# Patient Record
Sex: Female | Born: 2000 | Race: White | Marital: Single
Health system: Northeastern US, Academic
[De-identification: ages and names within clinical notes are randomized; demographics above are authoritative.]

## PROBLEM LIST (undated history)

## (undated) DIAGNOSIS — E049 Nontoxic goiter, unspecified: Secondary | ICD-10-CM

## (undated) DIAGNOSIS — E063 Autoimmune thyroiditis: Secondary | ICD-10-CM

## (undated) DIAGNOSIS — N62 Hypertrophy of breast: Secondary | ICD-10-CM

## (undated) HISTORY — PX: TONSILLECTOMY: SUR1361

---
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
## 2019-07-02 ENCOUNTER — Ambulatory Visit: Payer: Self-pay | Admitting: Osteopathic Medicine

## 2019-11-30 ENCOUNTER — Ambulatory Visit: Payer: Self-pay | Attending: Internal Medicine

## 2019-11-30 DIAGNOSIS — Z23 Encounter for immunization: Secondary | ICD-10-CM | POA: Insufficient documentation

## 2019-11-30 NOTE — Progress Notes (Signed)
   Covid-19 Vaccination Clinic  Name:  Lashara Urey    MRN: [id number] DOB: November 07, 2000  11/30/2019  Ms. Deihl was observed post Covid-19 immunization for 15 minutes without incident. She was provided with Vaccine Information Sheet and instruction to access the V-Safe system.   Ms. Quinlivan was instructed to call 911 with any severe reactions post vaccine: Marland Kitchen Difficulty breathing  . Swelling of face and throat  . A fast heartbeat  . A bad rash all over body  . Dizziness and weakness   Immunizations Administered    Name Date Dose VIS Date Route   Pfizer COVID-19 Vaccine 11/30/2019  5:44 PM 0.3 mL 09/04/2019 Intramuscular   Manufacturer: [employer], [employer]   Lot: [id number]   NDC: [address]-9

## 2019-12-21 ENCOUNTER — Ambulatory Visit: Payer: Self-pay | Attending: Internal Medicine

## 2019-12-21 DIAGNOSIS — Z23 Encounter for immunization: Secondary | ICD-10-CM

## 2019-12-21 NOTE — Progress Notes (Signed)
   Covid-19 Vaccination Clinic  Name:  Paula Franco    MRN: [id number] DOB: April 29, 2001  12/21/2019  Paula Franco was observed post Covid-19 immunization for 15 minutes without incident. She was provided with Vaccine Information Sheet and instruction to access the V-Safe system.   Paula Franco was instructed to call 911 with any severe reactions post vaccine: Marland Kitchen Difficulty breathing  . Swelling of face and throat  . A fast heartbeat  . A bad rash all over body  . Dizziness and weakness   Immunizations Administered    Name Date Dose VIS Date Route   Pfizer COVID-19 Vaccine 12/21/2019 11:14 AM 0.3 mL 09/04/2019 Intramuscular   Manufacturer: [employer], [employer]   Lot: [id number]   NDC: [address]-9

## 2021-05-19 ENCOUNTER — Emergency Department (HOSPITAL_COMMUNITY)
Admission: EM | Admit: 2021-05-19 | Discharge: 2021-05-20 | Disposition: A | Discharge status: Home / Self Care | Payer: BC Managed Care – PPO | Attending: Emergency Medicine | Admitting: Emergency Medicine

## 2021-05-19 ENCOUNTER — Emergency Department (HOSPITAL_COMMUNITY): Payer: BC Managed Care – PPO

## 2021-05-19 ENCOUNTER — Other Ambulatory Visit: Payer: Self-pay

## 2021-05-19 ENCOUNTER — Encounter (HOSPITAL_COMMUNITY): Payer: Self-pay

## 2021-05-19 DIAGNOSIS — T18198A Other foreign object in esophagus causing other injury, initial encounter: Secondary | ICD-10-CM | POA: Insufficient documentation

## 2021-05-19 DIAGNOSIS — K222 Esophageal obstruction: Secondary | ICD-10-CM | POA: Diagnosis not present

## 2021-05-19 DIAGNOSIS — T189XXA Foreign body of alimentary tract, part unspecified, initial encounter: Secondary | ICD-10-CM | POA: Diagnosis present

## 2021-05-19 DIAGNOSIS — K2210 Ulcer of esophagus without bleeding: Secondary | ICD-10-CM | POA: Insufficient documentation

## 2021-05-19 DIAGNOSIS — K2289 Other specified disease of esophagus: Secondary | ICD-10-CM | POA: Insufficient documentation

## 2021-05-19 DIAGNOSIS — Z20822 Contact with and (suspected) exposure to covid-19: Secondary | ICD-10-CM | POA: Diagnosis not present

## 2021-05-19 DIAGNOSIS — X58XXXA Exposure to other specified factors, initial encounter: Secondary | ICD-10-CM | POA: Diagnosis not present

## 2021-05-19 DIAGNOSIS — R1310 Dysphagia, unspecified: Secondary | ICD-10-CM

## 2021-05-19 LAB — POC URINE PREG, ED: Preg Test, Ur: NEGATIVE

## 2021-05-19 MED ORDER — LORAZEPAM 2 MG/ML IJ SOLN
1.0000 mg | Freq: Once | INTRAMUSCULAR | Status: AC
  Administered 2021-05-19: 1 mg via INTRAMUSCULAR
  Filled 2021-05-19: qty 1

## 2021-05-19 MED ORDER — GLUCAGON HCL RDNA (DIAGNOSTIC) 1 MG IJ SOLR
1.0000 mg | Freq: Once | INTRAMUSCULAR | Status: AC
  Administered 2021-05-19: 1 mg via INTRAMUSCULAR
  Filled 2021-05-19: qty 1

## 2021-05-19 MED ORDER — STERILE WATER FOR INJECTION IJ SOLN
INTRAMUSCULAR | Status: AC
  Administered 2021-05-19: 1 mL via INTRAMUSCULAR
  Filled 2021-05-19: qty 10

## 2021-05-19 MED ORDER — LIDOCAINE VISCOUS HCL 2 % MT SOLN
15.0000 mL | Freq: Once | OROMUCOSAL | Status: AC
  Administered 2021-05-19: 15 mL via ORAL
  Filled 2021-05-19: qty 15

## 2021-05-19 MED ORDER — ALUM & MAG HYDROXIDE-SIMETH 200-200-20 MG/5ML PO SUSP
30.0000 mL | Freq: Once | ORAL | Status: AC
  Administered 2021-05-19: 30 mL via ORAL
  Filled 2021-05-19: qty 30

## 2021-05-19 NOTE — ED Provider Notes (Signed)
Emergency Medicine Provider Triage Evaluation Note  Paula Franco , a 20 y.o. female  was evaluated in triage.  Pt complains of feeling like a pill stuck in her throat.  Patient reports that she tried to take a tablet of ibuprofen around 430 and felt like it got stuck, now she feels like she cannot swallow her saliva and is spitting this into a bag.  Patient is very anxious and tearful and states that she does not usually take pills she usually takes liquids that she is always have trouble swallowing these.  She has never been told that she has an esophageal stricture or problems with her esophagus.  She reports she usually does not have issues when eating or drinking.  Review of Systems  Positive: Difficulty swallowing Negative: Abdominal pain, fever, shortness of breath  Physical Exam  BP (!) 146/91 (BP Location: Left Arm)   Pulse (!) 106   Temp 98.6 F (37 C) (Oral)   Resp 18   SpO2 99%  Gen:   Awake, patient is tearful and extremely anxious Resp:  Normal effort, no stridor MSK:   Moves extremities without difficulty  Other:  Spitting saliva into a bag  Medical Decision Making  Medically screening exam initiated at 6:25 PM.  Appropriate orders placed.  Nigel Wessman was informed that the remainder of the evaluation will be completed by another provider, this initial triage assessment does not replace that evaluation, and the importance of remaining in the ED until their evaluation is complete.  Ordered GI cocktail to help with irritation from likely pill esophagitis   Dartha Lodge, PA-C 05/19/21 1840    Rozelle Logan, DO 05/19/21 2310

## 2021-05-19 NOTE — ED Provider Notes (Addendum)
[facility]-EMERGENCY DEPT Provider Note   CSN: [id number] Arrival date & time: 05/19/21  1731     History Chief Complaint  Patient presents with   Swallowed Foreign Body    Paula Franco is a 20 y.o. female who presents to the ED today with complaint of FB sensation in throat. Pt reportedly swallowed a OTC tablet of Ibuprofen around 4:30 PM today and felt like it got stuck. She states that she does have a fear of pills and feels like she may have gotten anxious prior to taking the pill. Since that time she has had trouble swallowing her saliva as well as any liquids. She has not tried solids. She does admit she does not typically swallow pills of any kind. She denies shortness of breath. No other complaints at this time.   The history is provided by the patient and medical records.      History reviewed. No pertinent past medical history.  There are no problems to display for this patient.   History reviewed. No pertinent surgical history.   OB History   No obstetric history on file.     History reviewed. No pertinent family history.     Home Medications Prior to Admission medications   Medication Sig Start Date End Date Taking? Authorizing Provider  ALTAVERA 0.15-30 MG-MCG tablet Take 1 tablet by mouth daily. 05/10/21  Yes [provider]  ibuprofen (ADVIL) 200 MG tablet Take 200 mg by mouth every 6 (six) hours as needed.   Yes [provider]    Allergies    Patient has no known allergies.  Review of Systems   Review of Systems  Constitutional:  Negative for fever.  HENT:  Positive for drooling and trouble swallowing.   Respiratory:  Negative for shortness of breath.   All other systems reviewed and are negative.  Physical Exam Updated Vital Signs BP 121/76   Pulse 85   Temp 98.6 F (37 C) (Oral)   Resp 18   SpO2 98%   Physical Exam Vitals and nursing note reviewed.  Constitutional:      Appearance: She is not  ill-appearing or diaphoretic.     Comments: Speaking in full sentences without stridor however continually spitting her saliva into an emesis bag at bedside  HENT:     Head: Normocephalic and atraumatic.     Mouth/Throat:     Comments: Posterior oropharynx clear. Uvula midline.  Eyes:     Conjunctiva/sclera: Conjunctivae normal.  Cardiovascular:     Rate and Rhythm: Normal rate and regular rhythm.  Pulmonary:     Effort: Pulmonary effort is normal.     Breath sounds: Normal breath sounds. No wheezing or rhonchi.  Abdominal:     Palpations: Abdomen is soft.     Tenderness: There is no abdominal tenderness. There is no guarding or rebound.  Musculoskeletal:     Cervical back: Neck supple.  Skin:    General: Skin is warm and dry.  Neurological:     Mental Status: She is alert.    ED Results / Procedures / Treatments   Labs (all labs ordered are listed, but only abnormal results are displayed) Labs Reviewed  POC URINE PREG, ED    EKG None  Radiology CT Soft Tissue Neck Wo Contrast  Result Date: 05/19/2021 CLINICAL DATA:  Initial evaluation for possible foreign body, swallowed pill, currently still stuck in throat. EXAM: CT NECK WITHOUT CONTRAST TECHNIQUE: Multidetector CT imaging of the neck was performed  following the standard protocol without intravenous contrast. COMPARISON:  None available. FINDINGS: Pharynx and larynx: Oral cavity within normal limits. No acute abnormality seen about the dentition. Oropharynx and nasopharynx within normal limits. No retropharyngeal collection or swelling. Epiglottis normal. Vallecula clear. Remainder of the hypopharynx and supraglottic larynx within normal limits. Piriform sinuses clear. Glottis within normal limits. Subglottic airway widely patent clear. Intraluminal fluid density seen within a short-segment of the upper esophageal lumen, measuring approximately 3.4 cm in length (series 6, image 45). Visualized upper esophagus otherwise  unremarkable. No visible radiopaque foreign body. Salivary glands: Salivary glands including the parotid and submandibular glands are within normal limits. Thyroid: Thyroid not well seen on this noncontrast examination. No obvious abnormality. Lymph nodes: No enlarged or pathologic adenopathy within the neck. Vascular: Evaluation of the vascular structures limited given lack of IV contrast. Limited intracranial: Unremarkable. Visualized orbits: Unremarkable. Mastoids and visualized paranasal sinuses: Scattered mucosal thickening noted about the ethmoidal air cells and maxillary sinuses bilaterally. Paranasal sinuses are otherwise clear. Mastoid air cells and middle ear cavities are well pneumatized and free of fluid. Skeleton: Visualized osseous structures within normal limits. No discrete or worrisome osseous lesions. Upper chest: Visualized upper chest demonstrates no other acute finding. Partially visualized lungs are clear. Other: None. IMPRESSION: 1. Intraluminal fluid density within a short-segment of the upper esophageal lumen, measuring approximately 3.4 cm in length. No other visible radiopaque foreign body. 2. Otherwise negative CT of the neck. No other acute abnormality identified. Electronically Signed   By: Rise Mu M.D.   On: 05/19/2021 22:37    Procedures Procedures   Medications Ordered in ED Medications  alum & mag hydroxide-simeth (MAALOX/MYLANTA) 200-200-20 MG/5ML suspension 30 mL (30 mLs Oral Given 05/19/21 1836)    And  lidocaine (XYLOCAINE) 2 % viscous mouth solution 15 mL (15 mLs Oral Given 05/19/21 1835)  LORazepam (ATIVAN) injection 1 mg (1 mg Intramuscular Given 05/19/21 1938)  glucagon (human recombinant) (GLUCAGEN) injection 1 mg (1 mg Intramuscular Given 05/19/21 2328)  sterile water (preservative free) injection (1 mL Intramuscular Given 05/19/21 2329)    ED Course  I have reviewed the triage vital signs and the nursing notes.  Pertinent labs & imaging results  that were available during my care of the patient were reviewed by me and considered in my medical decision making (see chart for details).    MDM Rules/Calculators/A&P                           20 year old female who presents to the ED today with complaint of foreign body sensation in throat after swallowing 1 over-the-counter tablet of ibuprofen around 4:30 PM today.  Has not been tolerating her own secretions since that time.  On arrival to the ED patient was afebrile, mildly tachycardic at 106, nontachypneic, satting 99% on room air.  Was initially provided with GI cocktail in triage without relief.  Continues to not be able to drink liquids of any kind.  She was brought back to her room and provided with 1 dose of IM Ativan for relaxation as patient does have history of GAD and seems to be very anxious at this time.  I had attempted to have her drink water afterwards however she spit it right back up.  Continues to spit into an emesis bag throughout examination however speaking in full sentences.  I had attempted to lightly massage her throat while she swallowed without success as well.  Has no  stridor on exam.  She denies any pain.  Her lungs are clear to auscultation bilaterally.  We will plan for CT soft tissue neck at this time for further evaluation of any blockage however patient denies eating or drinking anything else besides the 1 tablet of ibuprofen.   CT: IMPRESSION:  1. Intraluminal fluid density within a short-segment of the upper  esophageal lumen, measuring approximately 3.4 cm in length. No other  visible radiopaque foreign body.  2. Otherwise negative CT of the neck. No other acute abnormality  identified.   Discussed case with gastroenterologist Dr. Loreta Ave who recommends glucagon and otherwise trying a small amount of food to see if pt can essentially push the fluid down as this may cause her relief. Does not think she requires EGD.   IM glucagon provided without desired  effect. Pt attempted to drink water however regurgitated it back up immediately.   At shift change case signed out to Sharilyn Sites, PA-C, who will continue monitoring patient and attempt to have her drink. May need to re-consult GI in the AM if pt still unable to tolerate secretions.  This note was prepared using Dragon voice recognition software and may include unintentional dictation errors due to the inherent limitations of voice recognition software.   Final Clinical Impression(s) / ED Diagnoses Final diagnoses:  None    Rx / DC Orders ED Discharge Orders     None           Tanda Rockers, PA-C 05/19/21 2350    Koleen Distance, MD 05/20/21 2328

## 2021-05-19 NOTE — ED Triage Notes (Signed)
Pt reports choking on a pill around 430p that is still currently still stuck in her throat. Pt is unable to swallow saliva at this time.

## 2021-05-20 ENCOUNTER — Emergency Department (HOSPITAL_COMMUNITY): Payer: BC Managed Care – PPO | Admitting: Anesthesiology

## 2021-05-20 ENCOUNTER — Encounter (HOSPITAL_COMMUNITY): Payer: Self-pay | Admitting: *Deleted

## 2021-05-20 ENCOUNTER — Encounter (HOSPITAL_COMMUNITY)
Admission: EM | Disposition: A | Discharge status: Home / Self Care | Payer: Self-pay | Source: Home / Self Care | Attending: Emergency Medicine

## 2021-05-20 HISTORY — PX: ESOPHAGOGASTRODUODENOSCOPY: SHX5428

## 2021-05-20 HISTORY — PX: FOREIGN BODY REMOVAL: SHX962

## 2021-05-20 HISTORY — PX: BIOPSY: SHX5522

## 2021-05-20 LAB — RESP PANEL BY RT-PCR (FLU A&B, COVID) ARPGX2
Influenza A by PCR: NEGATIVE
Influenza B by PCR: NEGATIVE
SARS Coronavirus 2 by RT PCR: NEGATIVE

## 2021-05-20 SURGERY — EGD (ESOPHAGOGASTRODUODENOSCOPY)
Anesthesia: Monitor Anesthesia Care

## 2021-05-20 MED ORDER — PROPOFOL 500 MG/50ML IV EMUL
INTRAVENOUS | Status: DC | PRN
  Administered 2021-05-20: 120 ug/kg/min via INTRAVENOUS

## 2021-05-20 MED ORDER — SODIUM CHLORIDE 0.9 % IV SOLN: INTRAVENOUS | Status: DC

## 2021-05-20 MED ORDER — LIDOCAINE 2% (20 MG/ML) 5 ML SYRINGE
INTRAMUSCULAR | Status: DC | PRN
  Administered 2021-05-20: 60 mg via INTRAVENOUS

## 2021-05-20 MED ORDER — PROPOFOL 10 MG/ML IV BOLUS
INTRAVENOUS | Status: AC
  Filled 2021-05-20: qty 20

## 2021-05-20 MED ORDER — SODIUM CHLORIDE 0.9 % IV BOLUS
1000.0000 mL | Freq: Once | INTRAVENOUS | Status: AC
  Administered 2021-05-20: 1000 mL via INTRAVENOUS

## 2021-05-20 MED ORDER — LACTATED RINGERS IV SOLN: INTRAVENOUS | Status: DC | PRN

## 2021-05-20 MED ORDER — PROPOFOL 10 MG/ML IV BOLUS
INTRAVENOUS | Status: DC | PRN
  Administered 2021-05-20: 30 mg via INTRAVENOUS
  Administered 2021-05-20: 40 mg via INTRAVENOUS
  Administered 2021-05-20: 10 mg via INTRAVENOUS
  Administered 2021-05-20 (×2): 20 mg via INTRAVENOUS

## 2021-05-20 NOTE — Anesthesia Postprocedure Evaluation (Signed)
Anesthesia Post Note  Patient: Paula Franco  Procedure(s) Performed: ESOPHAGOGASTRODUODENOSCOPY (EGD) FOREIGN BODY REMOVAL     Patient location during evaluation: PACU Anesthesia Type: MAC Level of consciousness: awake and alert Pain management: pain level controlled Vital Signs Assessment: post-procedure vital signs reviewed and stable Respiratory status: spontaneous breathing and respiratory function stable Cardiovascular status: stable Postop Assessment: no apparent nausea or vomiting Anesthetic complications: no   No notable events documented.  Last Vitals:  Vitals:   05/20/21 0854 05/20/21 0859  BP: 106/69 (!) 112/59  Pulse: 81 (!) 55  Resp: 16 14  Temp:    SpO2: 99% 96%    Last Pain:  Vitals:   05/20/21 0859  TempSrc:   PainSc: 0-No pain                 Griselda Tosh DANIEL

## 2021-05-20 NOTE — ED Provider Notes (Signed)
Assumed care at shift change.  See prior notes for full H&P.  Briefly, 20 y.o. F here with trouble swallowing after taking ibuprofen tablet.  Here in the ED, has been unable to handle her secretions and is continuously spitting into emesis bag.  CT soft tissue of neck obtained with intraluminal fluid density along upper esophageal lumen without other visible foreign body.  Given ativan, tried massage of the neck, without relief.  GI was consulted and recommended trial of glucagon.    Plan:  given glucagon, will observe.  No change with glucagon.  Attempted to try to relax patient further.  Still no change.  Will observe here a bit to see if any improvement.  5:28 AM Attempted to have patient drink water once again-- she clearly swallows the water and within 5 seconds she immediately vomits it back up.  She did this twice in my presence with same result.  Still feels like something is stuck in her throat.  Will discuss with GI again, likely will need EGD.  Discussed with Dr. Loreta Ave-- will perform EGD this morning, keep NPO.  Covid screen sent.   Garlon Hatchet, PA-C 05/20/21 1607    Gilda Crease, MD 05/20/21 [phone]

## 2021-05-20 NOTE — ED Provider Notes (Signed)
Patient is status post EGD with Dr. Loreta Ave.  I have spoken with Dr. Loreta Ave who is giving the patient outpatient medications including Carafate and a PPI along with specific outpatient follow-up and directions.  This is all discussed with the patient and her husband.  She has appears otherwise appropriate for discharge with strict return precautions.   Arthor Captain, PA-C 05/20/21 1001    Virgina Norfolk, DO 05/20/21 1216

## 2021-05-20 NOTE — Anesthesia Preprocedure Evaluation (Addendum)
Anesthesia Evaluation  Patient identified by MRN, date of birth, ID band Patient awake    Reviewed: Allergy & Precautions, NPO status , Patient's Chart, lab work & pertinent test results  History of Anesthesia Complications Negative for: history of anesthetic complications  Airway Mallampati: II   Neck ROM: Full    Dental no notable dental hx. (+) Dental Advisory Given   Pulmonary neg pulmonary ROS,    Pulmonary exam normal        Cardiovascular negative cardio ROS Normal cardiovascular exam     Neuro/Psych negative neurological ROS     GI/Hepatic Neg liver ROS,   Endo/Other  negative endocrine ROS  Renal/GU negative Renal ROS     Musculoskeletal negative musculoskeletal ROS (+)   Abdominal   Peds  Hematology negative hematology ROS (+)   Anesthesia Other Findings   Reproductive/Obstetrics                            Anesthesia Physical Anesthesia Plan  ASA: 2 and emergent  Anesthesia Plan: General   Post-op Pain Management:    Induction:   PONV Risk Score and Plan: 3 and Ondansetron and Propofol infusion  Airway Management Planned: Natural Airway  Additional Equipment:   Intra-op Plan:   Post-operative Plan:   Informed Consent: I have reviewed the patients History and Physical, chart, labs and discussed the procedure including the risks, benefits and alternatives for the proposed anesthesia with the patient or authorized representative who has indicated his/her understanding and acceptance.     Dental advisory given  Plan Discussed with: Anesthesiologist and CRNA  Anesthesia Plan Comments:        Anesthesia Quick Evaluation

## 2021-05-20 NOTE — ED Notes (Signed)
Pt transported to endo.  

## 2021-05-20 NOTE — Discharge Instructions (Addendum)
Please follow-up as directed by Dr. Loreta Ave and take the medication she has prescribed.  Return if you are unable to tolerate swallowing fluids.

## 2021-05-20 NOTE — Consult Note (Signed)
UNASSIGNED PATIENT Reason for Consult: Difficulty swallowing. Referring Physician: ER MD  Paula Franco is an 20 y.o. female.  HPI: Paula Franco is a 20 year old white female who was in her usual state of health till about 4:30 PM yesterday when she took of ibuprofen and was not able to swallow it down.  Since then she has had difficulty managing her secretions and has been in the emergency room all through the night.  A CT of the neck revealed intraluminal density in the upper esophagus.  She claims she ate a bagel at about 11 AM yesterday but had no problems swallowing the bagel.  She had some problems swallowing pills up in the past but denies any dysphagia for solids or liquids. She denies recent use of antibiotics like doxycycline and has never had a food impaction before.  She denies a previous history of acid reflux or indigestion.  History reviewed.  Hypothyroidism, anxiety disorder.  History reviewed.  Tonsillectomy 2013, Breast reduction 2018.  History reviewed. No pertinent family history.  Social History:  has no history on file for tobacco use, alcohol use, and drug use.  Allergies: No Known Allergies  Medications: I have reviewed the patient's current medications.  Results for orders placed or performed during the hospital encounter of 05/19/21 (from the past 48 hour(s))  POC urine preg, ED     Status: None   Collection Time: 05/19/21  9:39 PM  Result Value Ref Range   Preg Test, Ur NEGATIVE NEGATIVE    Comment:        THE SENSITIVITY OF THIS METHODOLOGY IS >24 mIU/mL   Resp Panel by RT-PCR (Flu A&B, Covid) Nasopharyngeal Swab     Status: None   Collection Time: 05/20/21  5:36 AM   Specimen: Nasopharyngeal Swab; Nasopharyngeal(NP) swabs in vial transport medium  Result Value Ref Range   SARS Coronavirus 2 by RT PCR NEGATIVE NEGATIVE    Comment: (NOTE) SARS-CoV-2 target nucleic acids are NOT DETECTED.  The SARS-CoV-2 RNA is generally detectable in upper  respiratory specimens during the acute phase of infection. The lowest concentration of SARS-CoV-2 viral copies this assay can detect is 138 copies/mL. A negative result does not preclude SARS-Cov-2 infection and should not be used as the sole basis for treatment or other patient management decisions. A negative result may occur with  improper specimen collection/handling, submission of specimen other than nasopharyngeal swab, presence of viral mutation(s) within the areas targeted by this assay, and inadequate number of viral copies(<138 copies/mL). A negative result must be combined with clinical observations, patient history, and epidemiological information. The expected result is Negative.  Fact Sheet for Patients:  BloggerCourse.com  Fact Sheet for Healthcare Providers:  SeriousBroker.it  This test is no t yet approved or cleared by the [place] FDA and  has been authorized for detection and/or diagnosis of SARS-CoV-2 by FDA under an Emergency Use Authorization (EUA). This EUA will remain  in effect (meaning this test can be used) for the duration of the COVID-19 declaration under Section 564(b)(1) of the Act, 21 U.S.C.section 360bbb-3(b)(1), unless the authorization is terminated  or revoked sooner.       Influenza A by PCR NEGATIVE NEGATIVE   Influenza B by PCR NEGATIVE NEGATIVE    Comment: (NOTE) The Xpert Xpress SARS-CoV-2/FLU/RSV plus assay is intended as an aid in the diagnosis of influenza from Nasopharyngeal swab specimens and should not be used as a sole basis for treatment. Nasal washings and aspirates are unacceptable for Xpert Xpress  SARS-CoV-2/FLU/RSV testing.  Fact Sheet for Patients: BloggerCourse.com  Fact Sheet for Healthcare Providers: SeriousBroker.it  This test is not yet approved or cleared by the [place] FDA and has been authorized for  detection and/or diagnosis of SARS-CoV-2 by FDA under an Emergency Use Authorization (EUA). This EUA will remain in effect (meaning this test can be used) for the duration of the COVID-19 declaration under Section 564(b)(1) of the Act, 21 U.S.C. section 360bbb-3(b)(1), unless the authorization is terminated or revoked.  Performed at [facility], [address] [place], [place] [address]     CT Soft Tissue Neck Wo Contrast  Result Date: 05/19/2021 CLINICAL DATA:  Initial evaluation for possible foreign body, swallowed pill, currently still stuck in throat. EXAM: CT NECK WITHOUT CONTRAST TECHNIQUE: Multidetector CT imaging of the neck was performed following the standard protocol without intravenous contrast. COMPARISON:  None available. FINDINGS: Pharynx and larynx: Oral cavity within normal limits. No acute abnormality seen about the dentition. Oropharynx and nasopharynx within normal limits. No retropharyngeal collection or swelling. Epiglottis normal. Vallecula clear. Remainder of the hypopharynx and supraglottic larynx within normal limits. Piriform sinuses clear. Glottis within normal limits. Subglottic airway widely patent clear. Intraluminal fluid density seen within a short-segment of the upper esophageal lumen, measuring approximately 3.4 cm in length (series 6, image 45). Visualized upper esophagus otherwise unremarkable. No visible radiopaque foreign body. Salivary glands: Salivary glands including the parotid and submandibular glands are within normal limits. Thyroid: Thyroid not well seen on this noncontrast examination. No obvious abnormality. Lymph nodes: No enlarged or pathologic adenopathy within the neck. Vascular: Evaluation of the vascular structures limited given lack of IV contrast. Limited intracranial: Unremarkable. Visualized orbits: Unremarkable. Mastoids and visualized paranasal sinuses: Scattered mucosal thickening noted about the ethmoidal air cells and  maxillary sinuses bilaterally. Paranasal sinuses are otherwise clear. Mastoid air cells and middle ear cavities are well pneumatized and free of fluid. Skeleton: Visualized osseous structures within normal limits. No discrete or worrisome osseous lesions. Upper chest: Visualized upper chest demonstrates no other acute finding. Partially visualized lungs are clear. Other: None. IMPRESSION: 1. Intraluminal fluid density within a short-segment of the upper esophageal lumen, measuring approximately 3.4 cm in length. No other visible radiopaque foreign body. 2. Otherwise negative CT of the neck. No other acute abnormality identified. Electronically Signed   By: Rise Mu M.D.   On: 05/19/2021 22:37    Review of Systems Blood pressure 131/89, pulse 86, temperature 98.6 F (37 C), temperature source Oral, resp. rate 16, SpO2 97 %. Physical Exam Constitutional:      General: She is in acute distress.  HENT:     Head: Atraumatic.     Mouth/Throat:     Mouth: Mucous membranes are dry.  Eyes:     Extraocular Movements: Extraocular movements intact.     Pupils: Pupils are equal, round, and reactive to light.  Cardiovascular:     Rate and Rhythm: Normal rate and regular rhythm.  Abdominal:     General: Bowel sounds are normal.  Musculoskeletal:     Cervical back: Normal range of motion and neck supple.  Skin:    General: Skin is warm and dry.  Neurological:     General: No focal deficit present.     Mental Status: She is alert and oriented to person, place, and time.  Psychiatric:        Mood and Affect: Mood normal.        Behavior: Behavior normal.  Thought Content: Thought content normal.        Judgment: Judgment normal.   Assessment/Plan: Acute dysphagia with an abnormal CT of the neck-we will proceed with an EGD at this time as the patient is unable to swallow her secretions.  Charna Elizabeth 05/20/2021, 7:10 AM

## 2021-05-20 NOTE — Transfer of Care (Signed)
Immediate Anesthesia Transfer of Care Note  Patient: Paula Franco  Procedure(s) Performed: ESOPHAGOGASTRODUODENOSCOPY (EGD) FOREIGN BODY REMOVAL  Patient Location: Endoscopy Unit  Anesthesia Type:MAC  Level of Consciousness: drowsy  Airway & Oxygen Therapy: Patient Spontanous Breathing and Patient connected to face mask oxygen  Post-op Assessment: Report given to RN and Post -op Vital signs reviewed and stable  Post vital signs: Reviewed and stable  Last Vitals:  Vitals Value Taken Time  BP 108/61   Temp    Pulse 70   Resp 18   SpO2 98     Last Pain:  Vitals:   05/20/21 0747  TempSrc: Oral  PainSc: 0-No pain         Complications: No notable events documented.

## 2021-05-20 NOTE — Op Note (Signed)
[facility] Patient Name: Paula Franco Procedure Date: 05/20/2021 MRN: [id number] Attending MD: Juanita Craver , MD Date of Birth: 02/06/2001 CSN: [id number] Age: 20 Admit Type: Outpatient Procedure:                EGD with biospies and removal of pill lodged in                            esophagus. Indications:              Dysphagia, Abnormal CT of esophagus. Providers:                Juanita Craver, MD, Particia Nearing, RN, Elspeth Cho                            Tech., Technician, Lerry Paterson, CRNA Referring MD:             ER MD Medicines:                Monitored Anesthesia Care Complications:            No immediate complications. Estimated Blood Loss:     Estimated blood loss was minimal. Procedure:                Pre-Anesthesia Assessment: - Prior to the                            procedure, a history and physical was performed,                            and patient medications and allergies were                            reviewed. The patient's tolerance of previous                            anesthesia was also reviewed. The risks and                            benefits of the procedure and the sedation options                            and risks were discussed with the patient. All                            questions were answered, and informed consent was                            obtained. Prior Anticoagulants: The patient has                            taken no previous anticoagulant or antiplatelet                            agents. ASA Grade Assessment: I - A normal, healthy  patient. After reviewing the risks and benefits,                            the patient was deemed in satisfactory condition to                            undergo the procedure. After obtaining informed                            consent, the endoscope was passed under direct                            vision. Throughout the procedure, the patient's                             blood pressure, pulse, and oxygen saturations were                            monitored continuously. The GIF-H190 ([id number])                            Olympus endoscope was introduced through the mouth                            but could not be advanced beyond theUES. The scope                            was then withdrawn and a pediatric scope was used                            GIF [id number] instead; this was advanced up to the                            second portion of the duodenum. The EGD was                            performed with moderate difficulty due to UES                            stricture.arrowing. The patient tolerated the                            procedure well. Scope In: Scope Out: Findings:      One benign-appearing, intrinsic moderate stenosis was found; the       stenosis was traversed after downsizing scope.      A pill was noted to be lodged in the mid-esophagus at 20 cms-the tip of       the brush wa sused to break up the pill and the remenants were washed       into the stomach. Pill induced ulceration was noted at the site of the       impaction.Linear furrows were noted along the length of the esophagus       and esophageal biopsies were done to rule out  EoE.      The entire examined stomach was normal.      The cardia and gastric fundus were normal on retroflexion.      The examined duodenum was normal. Impression:               - Benign-appearing esophageal stenosis at                            UES-traversed after downsizing scope.                           - A pill was found lodged in the esophagus at 20                            cm-this was fragmented with the tip of a brush and                            remanents were pushed into the stomach.                           - Ulceration was noted at the site of the                            impaction-pill induced esophagitis.                           - Linear furrows were noted  along the length of the                            esophagus-biopsied to rule out Eosinophilic                            esophagitis.                           - Normal appearing stomach.                           - Normal examined duodenum.                           - No specimens collected. Moderate Sedation:      MAC used. Recommendation:           - Soft diet daily for 10 days.                           - Continue present medications.                           - Use Protonix (Pantoprazole) 40 mg PO daily.                           - Use sucralfate suspension 1 gram PO QID daily for  1 month.                           - No Ibuprofen, Naproxen, or other non-steroidal                            anti-inflammatory drugs.                           - Return to my office in 1 week. Procedure Code(s):        --- Professional ---                           [phone], Esophagogastroduodenoscopy, flexible,                            transoral; with removal of foreign body(s)                           [id number], Esophagogastroduodenoscopy, flexible,                            transoral; with biopsy, single or multiple Diagnosis Code(s):        --- Professional ---                           R13.10, Dysphagia, unspecified                           K22.2, Esophageal obstruction                           T18.198A, Other foreign object in esophagus causing                            other injury, initial encounter CPT copyright 2019 American [employer]. All rights reserved. The codes documented in this report are preliminary and upon coder review may  be revised to meet current compliance requirements. Juanita Craver, MD Juanita Craver, MD 05/20/2021 9:00:54 AM This report has been signed electronically. Number of Addenda: 0

## 2021-05-22 ENCOUNTER — Encounter (HOSPITAL_COMMUNITY): Payer: Self-pay | Admitting: Gastroenterology

## 2021-05-24 LAB — SURGICAL PATHOLOGY

## 2021-05-26 ENCOUNTER — Other Ambulatory Visit: Payer: Self-pay | Admitting: Gastroenterology

## 2021-05-26 DIAGNOSIS — R1310 Dysphagia, unspecified: Secondary | ICD-10-CM

## 2021-05-31 ENCOUNTER — Other Ambulatory Visit: Payer: Self-pay

## 2021-05-31 ENCOUNTER — Ambulatory Visit
Admission: RE | Admit: 2021-05-31 | Discharge: 2021-05-31 | Disposition: A | Discharge status: Home / Self Care | Payer: BC Managed Care – PPO | Source: Ambulatory Visit | Attending: Gastroenterology | Admitting: Gastroenterology

## 2021-05-31 ENCOUNTER — Other Ambulatory Visit: Payer: Self-pay | Admitting: Gastroenterology

## 2021-05-31 DIAGNOSIS — R1310 Dysphagia, unspecified: Secondary | ICD-10-CM

## 2021-11-20 ENCOUNTER — Other Ambulatory Visit: Payer: Self-pay | Admitting: Gastroenterology

## 2021-11-23 ENCOUNTER — Encounter (HOSPITAL_COMMUNITY): Payer: Self-pay | Admitting: Gastroenterology

## 2021-11-23 NOTE — Progress Notes (Signed)
Attempted to obtain medical history via telephone, unable to reach at this time. I left a voicemail to return pre surgical testing department's phone call.  

## 2021-12-01 ENCOUNTER — Encounter (HOSPITAL_COMMUNITY): Payer: Self-pay | Admitting: Gastroenterology

## 2021-12-01 ENCOUNTER — Ambulatory Visit (HOSPITAL_COMMUNITY): Payer: BC Managed Care – PPO | Admitting: Anesthesiology

## 2021-12-01 ENCOUNTER — Encounter (HOSPITAL_COMMUNITY)
Admission: RE | Disposition: A | Discharge status: Home / Self Care | Payer: Self-pay | Source: Ambulatory Visit | Attending: Gastroenterology

## 2021-12-01 ENCOUNTER — Other Ambulatory Visit: Payer: Self-pay

## 2021-12-01 ENCOUNTER — Ambulatory Visit (HOSPITAL_COMMUNITY)
Admission: RE | Admit: 2021-12-01 | Discharge: 2021-12-01 | Disposition: A | Discharge status: Home / Self Care | Payer: BC Managed Care – PPO | Source: Ambulatory Visit | Attending: Gastroenterology | Admitting: Gastroenterology

## 2021-12-01 DIAGNOSIS — R1310 Dysphagia, unspecified: Secondary | ICD-10-CM | POA: Insufficient documentation

## 2021-12-01 DIAGNOSIS — K222 Esophageal obstruction: Secondary | ICD-10-CM | POA: Diagnosis not present

## 2021-12-01 HISTORY — PX: ESOPHAGOGASTRODUODENOSCOPY (EGD) WITH PROPOFOL: SHX5813

## 2021-12-01 HISTORY — PX: SAVORY DILATION: SHX5439

## 2021-12-01 LAB — PREGNANCY, URINE: Preg Test, Ur: NEGATIVE

## 2021-12-01 SURGERY — ESOPHAGOGASTRODUODENOSCOPY (EGD) WITH PROPOFOL
Anesthesia: Monitor Anesthesia Care

## 2021-12-01 MED ORDER — LIDOCAINE 2% (20 MG/ML) 5 ML SYRINGE
INTRAMUSCULAR | Status: DC | PRN
  Administered 2021-12-01: 80 mg via INTRAVENOUS

## 2021-12-01 MED ORDER — SODIUM CHLORIDE 0.9 % IV SOLN: INTRAVENOUS | Status: DC

## 2021-12-01 MED ORDER — PROPOFOL 500 MG/50ML IV EMUL
INTRAVENOUS | Status: AC
Start: 2021-12-01 — End: ?
  Filled 2021-12-01: qty 50

## 2021-12-01 MED ORDER — LACTATED RINGERS IV SOLN
INTRAVENOUS | Status: AC | PRN
Start: 2021-12-01 — End: 2021-12-01
  Administered 2021-12-01: 20 mL/h via INTRAVENOUS

## 2021-12-01 MED ORDER — PROPOFOL 500 MG/50ML IV EMUL
INTRAVENOUS | Status: DC | PRN
  Administered 2021-12-01: 125 ug/kg/min via INTRAVENOUS

## 2021-12-01 MED ORDER — PROPOFOL 500 MG/50ML IV EMUL
INTRAVENOUS | Status: AC
  Filled 2021-12-01: qty 50

## 2021-12-01 MED ORDER — PROPOFOL 10 MG/ML IV BOLUS
INTRAVENOUS | Status: DC | PRN
  Administered 2021-12-01: 20 mg via INTRAVENOUS
  Administered 2021-12-01: 30 mg via INTRAVENOUS
  Administered 2021-12-01 (×2): 20 mg via INTRAVENOUS

## 2021-12-01 SURGICAL SUPPLY — 15 items

## 2021-12-01 NOTE — Op Note (Signed)
[facility] ?Patient Name: Paula Franco ?Procedure Date: 12/01/2021 ?MRN: [id number] ?Attending MD: Jeani HawkingPatrick Leaann Nevils , MD ?Date of Birth: 06/03/2001 ?CSN: [id number] ?Age: [age] ?Admit Type: Outpatient ?Procedure:                Upper GI endoscopy ?Indications:              Dysphagia ?Providers:                Jeani HawkingPatrick Earlean Fidalgo, MD, Willy EddyBrett McGrady, RN, Nicanor BakeGuillaume  ?                          Gretchen ShortAwaka, Technician ?Referring MD:              ?Medicines:                Propofol per Anesthesia ?Complications:            No immediate complications. ?Estimated Blood Loss:     Estimated blood loss was minimal. ?Procedure:                Pre-Anesthesia Assessment: ?                          - Prior to the procedure, a History and Physical  ?                          was performed, and patient medications and  ?                          allergies were reviewed. The patient's tolerance of  ?                          previous anesthesia was also reviewed. The risks  ?                          and benefits of the procedure and the sedation  ?                          options and risks were discussed with the patient.  ?                          All questions were answered, and informed consent  ?                          was obtained. Prior Anticoagulants: The patient has  ?                          taken no previous anticoagulant or antiplatelet  ?                          agents. ASA Grade Assessment: I - A normal, healthy  ?                          patient. After reviewing the risks and benefits,  ?                          the  patient was deemed in satisfactory condition to  ?                          undergo the procedure. ?                          - Sedation was administered by an anesthesia  ?                          professional. Deep sedation was attained. ?                          After obtaining informed consent, the endoscope was  ?                          passed under direct vision. Throughout the  ?                           procedure, the patient's blood pressure, pulse, and  ?                          oxygen saturations were monitored continuously. The  ?                          GIF-H190 ([id number]) Olympus endoscope was introduced  ?                          through the mouth, and advanced to the second part  ?                          of duodenum. The upper GI endoscopy was  ?                          accomplished without difficulty. The patient  ?                          tolerated the procedure well. ?Scope In: ?Scope Out: ?Findings: ?     Two benign-appearing, intrinsic moderate stenoses were found in the  ?     upper third of the esophagus. The narrowest stenosis measured 9 mm  ?     (inner diameter) x 8 cm (in length). The stenoses were traversed. A  ?     guidewire was placed and the scope was withdrawn. Dilation was performed  ?     with a Savary dilator with no resistance at 11 mm. The dilation site was  ?     examined following endoscope reinsertion and showed complete resolution  ?     of luminal narrowing. Estimated blood loss was minimal. ?     The stomach was normal. ?     The examined duodenum was normal. ?     The UES stricture was encountered and using scope tip deflection the  ?     stricture was traversed and dilated with the endoscope. A secondary  ?     stricture was identified and dilated with passage of the endoscope at  ?     the GE junction. Further dilation  was performed with the 10 and 11 mm  ?     Savary dilators. Minimal to no resistance was encountered with the  ?     dilations. The post dilation inspection revealed an excellent result. ?Impression:               - Benign-appearing esophageal stenoses. Dilated. ?                          - Normal stomach. ?                          - Normal examined duodenum. ?                          - No specimens collected. ?Moderate Sedation: ?     Not Applicable - Patient had care per Anesthesia. ?Recommendation:           - Patient has a contact number  available for  ?                          emergencies. The signs and symptoms of potential  ?                          delayed complications were discussed with the  ?                          patient. Return to normal activities tomorrow.  ?                          Written discharge instructions were provided to the  ?                          patient. ?                          - Resume previous diet. ?                          - Continue present medications. ?                          - Repeat upper endoscopy in 2 weeks for retreatment. ?Procedure Code(s):        --- Professional --- ?                          [id number], Esophagogastroduodenoscopy, flexible,  ?                          transoral; with insertion of guide wire followed by  ?                          passage of dilator(s) through esophagus over guide  ?                          wire ?Diagnosis Code(s):        --- Professional --- ?  K22.2, Esophageal obstruction ?                          R13.10, Dysphagia, unspecified ?CPT copyright 2019 American Medical Association. All rights reserved. ?The codes documented in this report are preliminary and upon coder review may  ?be revised to meet current compliance requirements. ?Jeani Hawking, MD ?Jeani Hawking, MD ?12/01/2021 11:45:50 AM ?This report has been signed electronically. ?Number of Addenda: 0 ?

## 2021-12-01 NOTE — Transfer of Care (Signed)
Immediate Anesthesia Transfer of Care Note ? ?Patient: Paula Franco ? ?Procedure(s) Performed: ESOPHAGOGASTRODUODENOSCOPY (EGD) WITH PROPOFOL ?SAVORY DILATION ? ?Patient Location: PACU ? ?Anesthesia Type:MAC ? ?Level of Consciousness: awake, alert  and oriented ? ?Airway & Oxygen Therapy: Patient Spontanous Breathing and Patient connected to face mask oxygen ? ?Post-op Assessment: Report given to RN and Post -op Vital signs reviewed and stable ? ?Post vital signs: Reviewed and stable ? ?Last Vitals:  ?Vitals Value Taken Time  ?BP 116/56 12/01/21 1146  ?Temp    ?Pulse 60 12/01/21 1147  ?Resp 22 12/01/21 1147  ?SpO2 100 % 12/01/21 1147  ?Vitals shown include unvalidated device data. ? ?Last Pain:  ?Vitals:  ? 12/01/21 1053  ?TempSrc: Temporal  ?PainSc: 0-No pain  ?   ? ?  ? ?Complications: No notable events documented. ?

## 2021-12-01 NOTE — H&P (Signed)
Paula Franco ?HPI: This 21 year old white female presents to the office for further evaluation of dysphagia. She is accompanied to the office today by her mother. She has a long history of problems with dysphagia. She had a normal barium swallow test done 05/31/2021;patient did refuse to swallow the barium tablet. She continues to have difficulty swallowing solids and liquids. She was taking Pantoprazole and Sucralfate suspension that she was given in September, 2022 but has now discontinued.  She has a lot of belching and hiccups post prandially. She has 2 BM's per day with no obvious blood or mucus in the stool. She has good appetite and her weight is stable. She denies any complaints of abdominal pain, acid reflux, nausea, vomiting, or odynophagia. She denies having a family history of colon cancer, celiac sprue, or IBD. ? ?History reviewed. No pertinent past medical history. ? ?Past Surgical History:  ?Procedure Laterality Date  ? BIOPSY  05/20/2021  ? Procedure: BIOPSY;  Surgeon: Paula Elizabeth, MD;  Location: [facility];  Service: Endoscopy;;  ? ESOPHAGOGASTRODUODENOSCOPY N/A 05/20/2021  ? Procedure: ESOPHAGOGASTRODUODENOSCOPY (EGD);  Surgeon: Paula Elizabeth, MD;  Location: [facility];  Service: Endoscopy;  Laterality: N/A;  ? FOREIGN BODY REMOVAL  05/20/2021  ? Procedure: FOREIGN BODY REMOVAL;  Surgeon: Paula Elizabeth, MD;  Location: [facility];  Service: Endoscopy;;  ? TONSILLECTOMY    ? ? ?History reviewed. No pertinent family history. ? ?Social History:  reports that she has never smoked. She has never used smokeless tobacco. She reports that she does not drink alcohol and does not use drugs. ? ?Allergies: No Known Allergies ? ?Medications: Scheduled: ?Continuous: ? ?No results found for this or any previous visit (from the past 24 hour(s)).  ? ?No results found. ? ?ROS:  As stated above in the HPI otherwise negative. ? ?There were no vitals taken for this visit.   ? ?PE: ?Gen: NAD, Alert and  Oriented ?HEENT:  [place]/AT, EOMI ?Neck: Supple, no LAD ?Lungs: CTA Bilaterally ?CV: RRR without M/G/R ?ABD: Soft, NTND, +BS ?Ext: No C/C/E ? ?Assessment/Plan: ?1) Dysphagia - EGD with dilation. ? ?Paula Franco D ?12/01/2021, 10:32 AM  ? ? ?  ? ?

## 2021-12-01 NOTE — Anesthesia Preprocedure Evaluation (Signed)
Anesthesia Evaluation  ?Patient identified by MRN, date of birth, ID band ?Patient awake ? ? ? ?Reviewed: ?Allergy & Precautions, NPO status , Patient's Chart, lab work & pertinent test results ? ?History of Anesthesia Complications ?Negative for: history of anesthetic complications ? ?Airway ?Mallampati: II ? ?TM Distance: >3 FB ?Neck ROM: Full ? ? ? Dental ?no notable dental hx. ?(+) Dental Advisory Given ?  ?Pulmonary ?neg pulmonary ROS,  ?  ?Pulmonary exam normal ? ? ? ? ? ? ? Cardiovascular ?negative cardio ROS ?Normal cardiovascular exam ? ? ?  ?Neuro/Psych ?negative neurological ROS ?   ? GI/Hepatic ?Neg liver ROS, Dysphagia ? ?  ?Endo/Other  ?negative endocrine ROS ? Renal/GU ?negative Renal ROS  ? ?  ?Musculoskeletal ? ? Abdominal ?  ?Peds ? Hematology ?negative hematology ROS ?(+)   ?Anesthesia Other Findings ? ? Reproductive/Obstetrics ? ?  ? ? ? ? ? ? ? ? ? ? ? ? ? ?  ?  ? ? ? ? ? ? ? ? ?Anesthesia Physical ? ?Anesthesia Plan ? ?ASA: 1 ? ?Anesthesia Plan: MAC  ? ?Post-op Pain Management:   ? ?Induction:  ? ?PONV Risk Score and Plan: Propofol infusion ? ?Airway Management Planned: Natural Airway and Nasal Cannula ? ?Additional Equipment:  ? ?Intra-op Plan:  ? ?Post-operative Plan:  ? ?Informed Consent: I have reviewed the patients History and Physical, chart, labs and discussed the procedure including the risks, benefits and alternatives for the proposed anesthesia with the patient or authorized representative who has indicated his/her understanding and acceptance.  ? ? ? ?Dental advisory given ? ?Plan Discussed with: Anesthesiologist and CRNA ? ?Anesthesia Plan Comments:   ? ? ? ? ? ? ?Anesthesia Quick Evaluation ? ?

## 2021-12-01 NOTE — Discharge Instructions (Signed)
YOU HAD AN ENDOSCOPIC PROCEDURE TODAY: Refer to the procedure report and other information in the discharge instructions given to you for any specific questions about what was found during the examination. If this information does not answer your questions, please call [facility] office at [phone] to clarify.   YOU SHOULD EXPECT: Some feelings of bloating in the abdomen. Passage of more gas than usual. Walking can help get rid of the air that was put into your GI tract during the procedure and reduce the bloating. If you had a lower endoscopy (such as a colonoscopy or flexible sigmoidoscopy) you may notice spotting of blood in your stool or on the toilet paper. Some abdominal soreness may be present for a day or two, also.  DIET: Your first meal following the procedure should be a light meal and then it is ok to progress to your normal diet. A half-sandwich or bowl of soup is an example of a good first meal. Heavy or fried foods are harder to digest and may make you feel nauseous or bloated. Drink plenty of fluids but you should avoid alcoholic beverages for 24 hours. If you had a esophageal dilation, please see attached instructions for diet.    ACTIVITY: Your care partner should take you home directly after the procedure. You should plan to take it easy, moving slowly for the rest of the day. You can resume normal activity the day after the procedure however YOU SHOULD NOT DRIVE, use power tools, machinery or perform tasks that involve climbing or major physical exertion for 24 hours (because of the sedation medicines used during the test).   SYMPTOMS TO REPORT IMMEDIATELY: A gastroenterologist can be reached at any hour. Please call [phone]  for any of the following symptoms:  Following lower endoscopy (colonoscopy, flexible sigmoidoscopy) Excessive amounts of blood in the stool  Significant tenderness, worsening of abdominal pains  Swelling of the abdomen that is new, acute  Fever of 100 or  higher  Following upper endoscopy (EGD, EUS, ERCP, esophageal dilation) Vomiting of blood or coffee ground material  New, significant abdominal pain  New, significant chest pain or pain under the shoulder blades  Painful or persistently difficult swallowing  New shortness of breath  Black, tarry-looking or red, bloody stools  FOLLOW UP:  If any biopsies were taken you will be contacted by phone or by letter within the next 1-3 weeks. Call [phone]  if you have not heard about the biopsies in 3 weeks.  Please also call with any specific questions about appointments or follow up tests.YOU HAD AN ENDOSCOPIC PROCEDURE TODAY: Refer to the procedure report and other information in the discharge instructions given to you for any specific questions about what was found during the examination. If this information does not answer your questions, please call  office at [phone] to clarify.   YOU SHOULD EXPECT: Some feelings of bloating in the abdomen. Passage of more gas than usual. Walking can help get rid of the air that was put into your GI tract during the procedure and reduce the bloating. If you had a lower endoscopy (such as a colonoscopy or flexible sigmoidoscopy) you may notice spotting of blood in your stool or on the toilet paper. Some abdominal soreness may be present for a day or two, also.  DIET: Your first meal following the procedure should be a light meal and then it is ok to progress to your normal diet. A half-sandwich or bowl of soup is an example of a   good first meal. Heavy or fried foods are harder to digest and may make you feel nauseous or bloated. Drink plenty of fluids but you should avoid alcoholic beverages for 24 hours. If you had a esophageal dilation, please see attached instructions for diet.    ACTIVITY: Your care partner should take you home directly after the procedure. You should plan to take it easy, moving slowly for the rest of the day. You can resume  normal activity the day after the procedure however YOU SHOULD NOT DRIVE, use power tools, machinery or perform tasks that involve climbing or major physical exertion for 24 hours (because of the sedation medicines used during the test).   SYMPTOMS TO REPORT IMMEDIATELY: A gastroenterologist can be reached at any hour. Please call [phone]  for any of the following symptoms:  Following lower endoscopy (colonoscopy, flexible sigmoidoscopy) Excessive amounts of blood in the stool  Significant tenderness, worsening of abdominal pains  Swelling of the abdomen that is new, acute  Fever of 100 or higher  Following upper endoscopy (EGD, EUS, ERCP, esophageal dilation) Vomiting of blood or coffee ground material  New, significant abdominal pain  New, significant chest pain or pain under the shoulder blades  Painful or persistently difficult swallowing  New shortness of breath  Black, tarry-looking or red, bloody stools  FOLLOW UP:  If any biopsies were taken you will be contacted by phone or by letter within the next 1-3 weeks. Call [phone]  if you have not heard about the biopsies in 3 weeks.  Please also call with any specific questions about appointments or follow up tests. 

## 2021-12-01 NOTE — Anesthesia Postprocedure Evaluation (Signed)
Anesthesia Post Note ? ?Patient: Paula Franco ? ?Procedure(s) Performed: ESOPHAGOGASTRODUODENOSCOPY (EGD) WITH PROPOFOL ?SAVORY DILATION ? ?  ? ?Patient location during evaluation: PACU ?Anesthesia Type: MAC ?Level of consciousness: awake and alert ?Pain management: pain level controlled ?Vital Signs Assessment: post-procedure vital signs reviewed and stable ?Respiratory status: spontaneous breathing, nonlabored ventilation, respiratory function stable and patient connected to nasal cannula oxygen ?Cardiovascular status: stable and blood pressure returned to baseline ?Postop Assessment: no apparent nausea or vomiting ?Anesthetic complications: no ? ? ?No notable events documented. ? ?Last Vitals:  ?Vitals:  ? 12/01/21 1156 12/01/21 1200  ?BP: 139/72 125/72  ?Pulse: 80   ?Resp: (!) 22 15  ?Temp:    ?SpO2: 98%   ?  ?Last Pain:  ?Vitals:  ? 12/01/21 1200  ?TempSrc:   ?PainSc: 0-No pain  ? ? ?  ?  ?  ?  ?  ?  ? ?Suzette Battiest E ? ? ? ? ?

## 2021-12-04 ENCOUNTER — Encounter (HOSPITAL_COMMUNITY): Payer: Self-pay | Admitting: Gastroenterology

## 2022-04-10 DIAGNOSIS — R1310 Dysphagia, unspecified: Secondary | ICD-10-CM | POA: Diagnosis not present

## 2022-04-10 DIAGNOSIS — K200 Eosinophilic esophagitis: Secondary | ICD-10-CM | POA: Diagnosis not present

## 2022-04-10 DIAGNOSIS — K219 Gastro-esophageal reflux disease without esophagitis: Secondary | ICD-10-CM | POA: Diagnosis not present

## 2022-06-21 DIAGNOSIS — K219 Gastro-esophageal reflux disease without esophagitis: Secondary | ICD-10-CM | POA: Diagnosis not present

## 2022-06-21 DIAGNOSIS — R1310 Dysphagia, unspecified: Secondary | ICD-10-CM | POA: Diagnosis not present

## 2022-12-17 IMAGING — RF DG ESOPHAGUS
9 series · 15 of 24 positions shown · non-contrast
Comparison: None.

CLINICAL DATA: Dysphagia.  Pill lodged in esophagus recently.

EXAM:
ESOPHOGRAM/BARIUM SWALLOW
TECHNIQUE: Combined double contrast and single contrast examination performed
using effervescent crystals, thick barium liquid, and thin barium
liquid.
FLUOROSCOPY TIME:  Fluoroscopy Time:  1 minute 36 seconds
Radiation Exposure Index (if provided by the fluoroscopic device):
4.35 mGy
Number of Acquired Spot Images: None.

[Series 1: sequence · 2 of 17 frames shown (1 of 6)]
[frame 3/17]
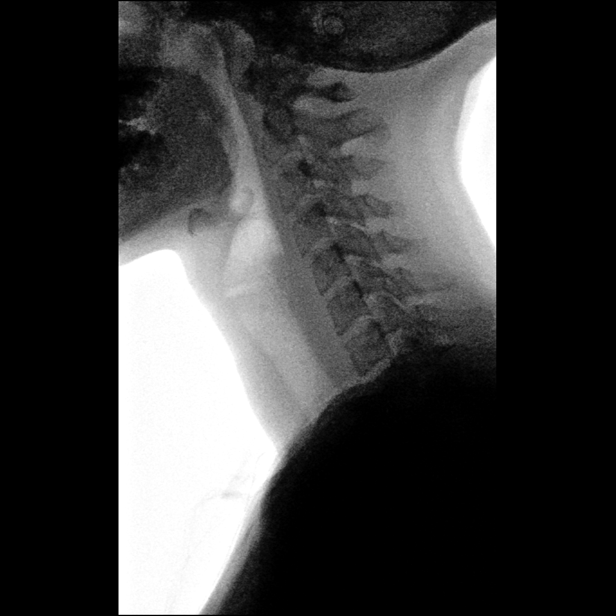
[frame 15/17]
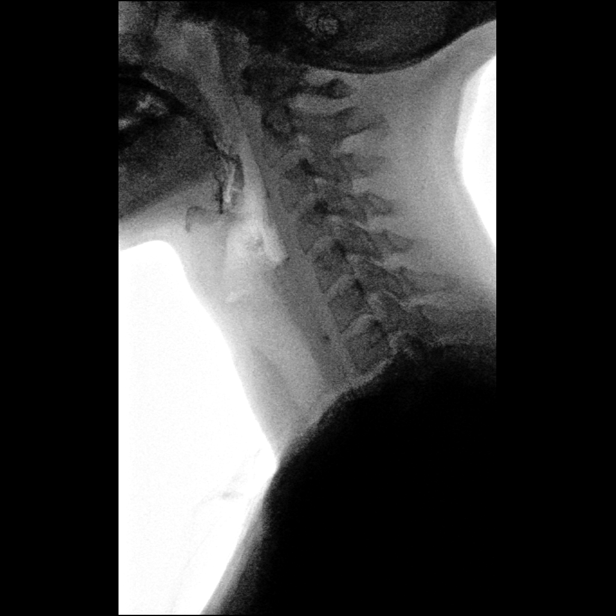

[Series 2: sequence · 2 of 19 frames shown (2 of 6)]
[frame 3/19]
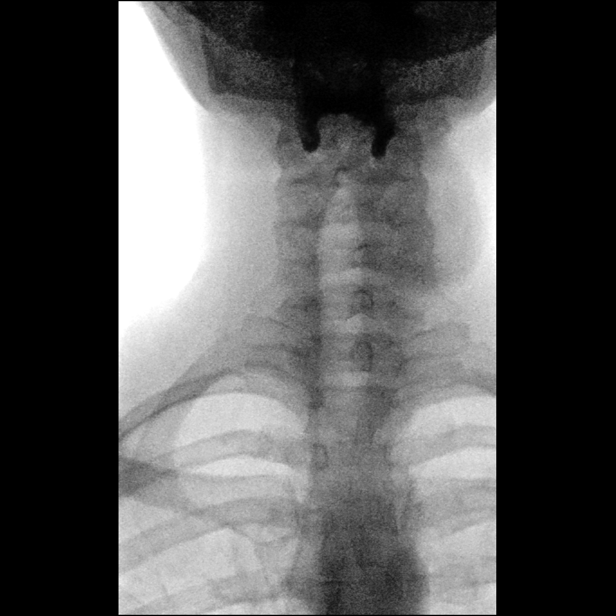
[frame 10/19]
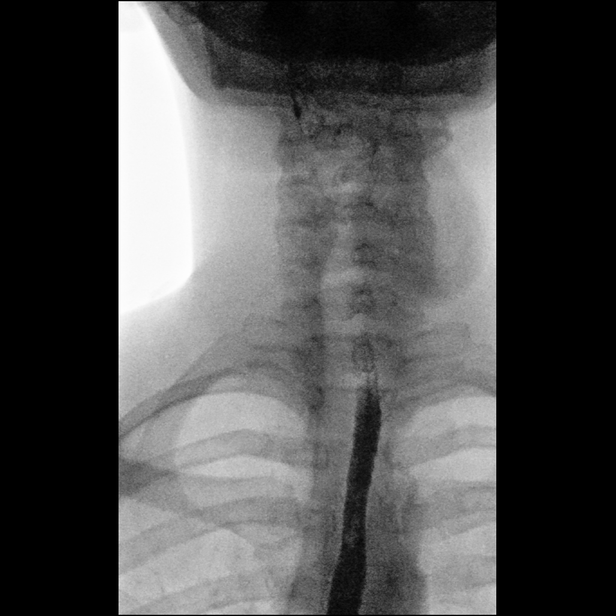

[Series 3: sequence · 2 of 35 frames shown (3 of 6)]
[frame 6/35]
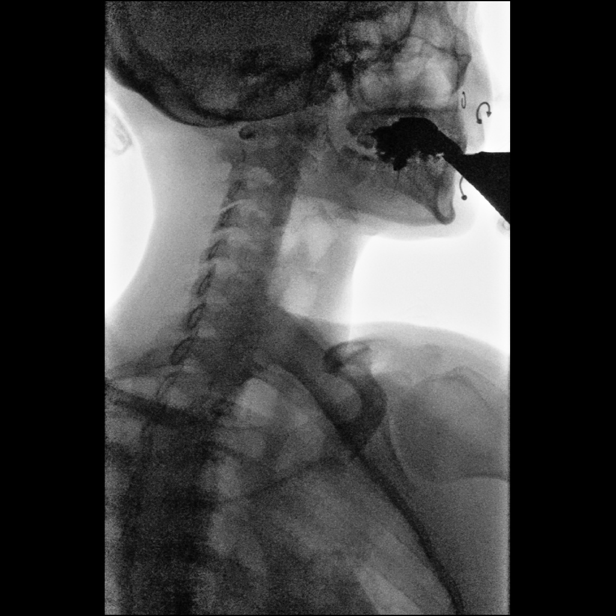
[frame 18/35]
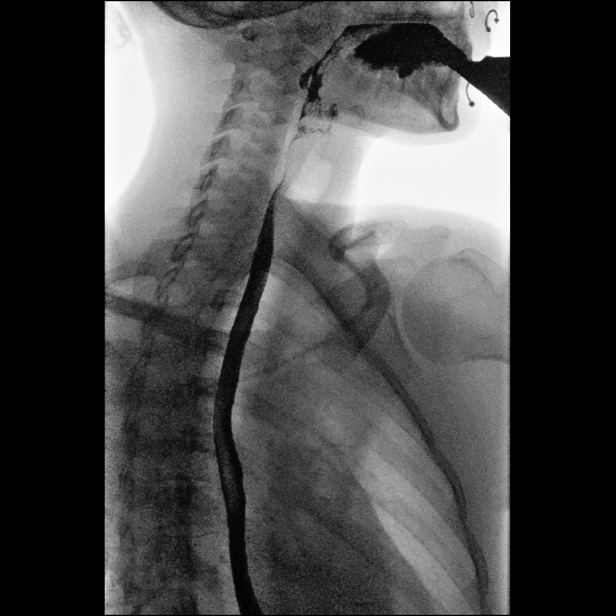

[Series 4: one shot · 1 of 3 slices shown (1 of 3)]
[im 2/3]
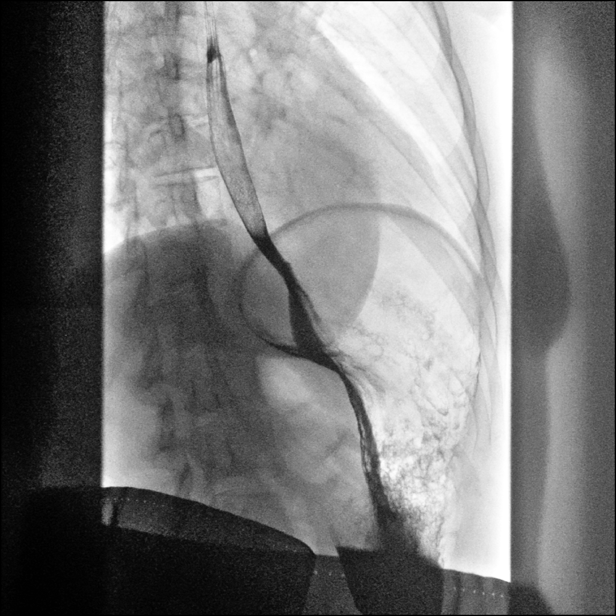

[Series 5: sequence · 2 of 25 frames shown (4 of 6)]
[frame 4/25]
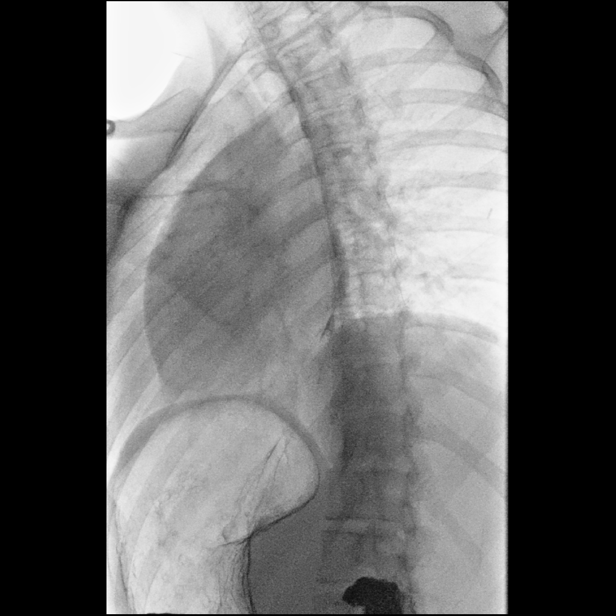
[frame 8/25]
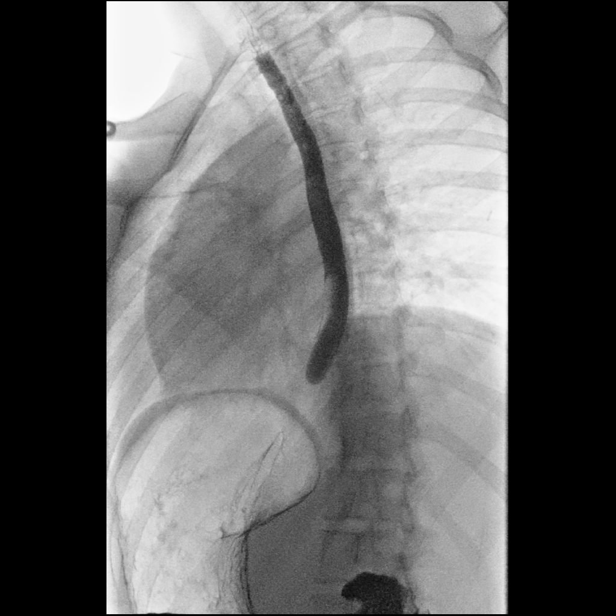

[Series 6: one shot · 1 of 1 slices shown (2 of 3)]
[im 1/1]
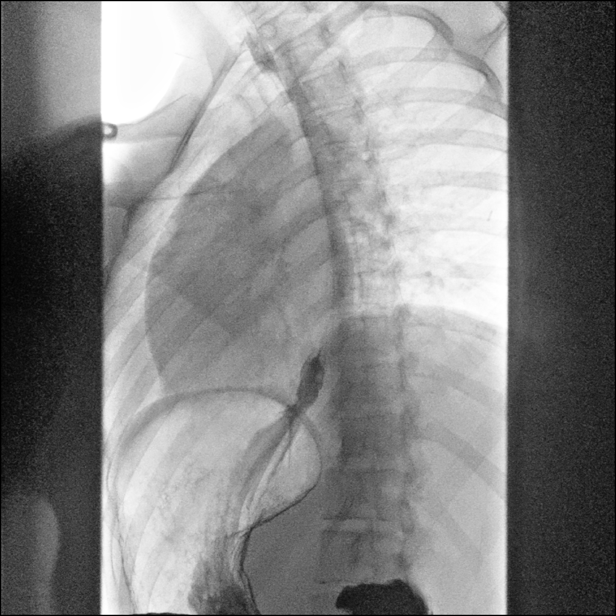

[Series 7: sequence · 2 of 26 frames shown (5 of 6)]
[frame 4/26]
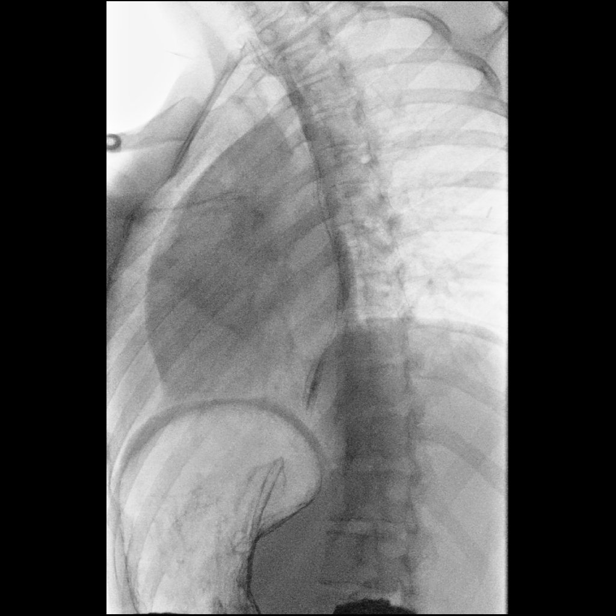
[frame 23/26]
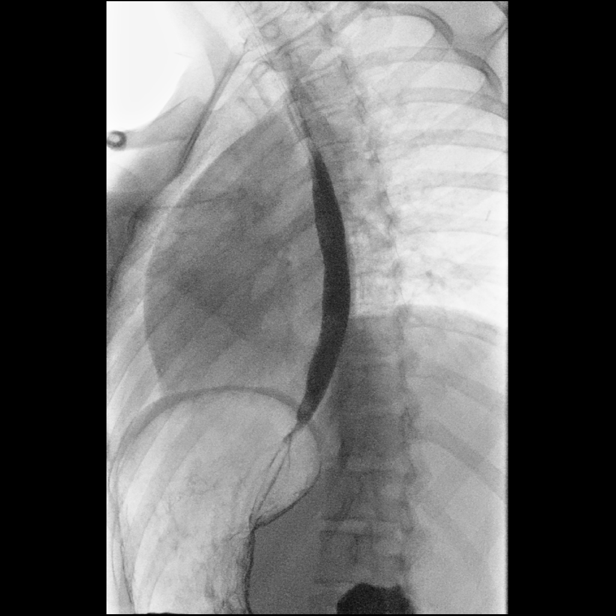

[Series 8: sequence · 2 of 27 frames shown (6 of 6)]
[frame 14/27]
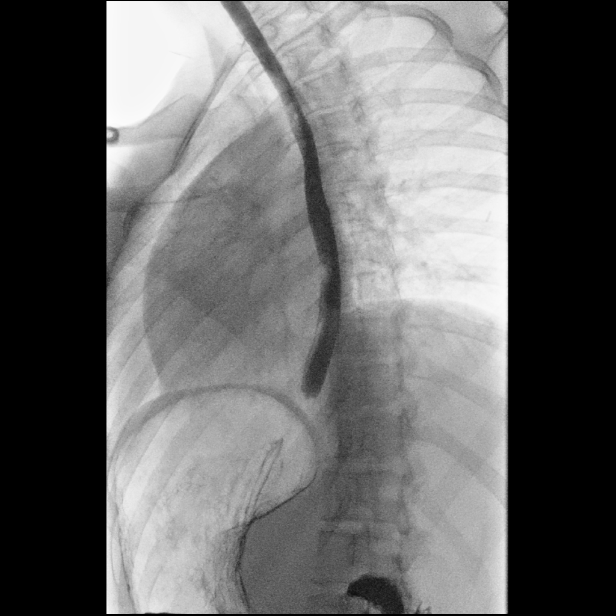
[frame 23/27]
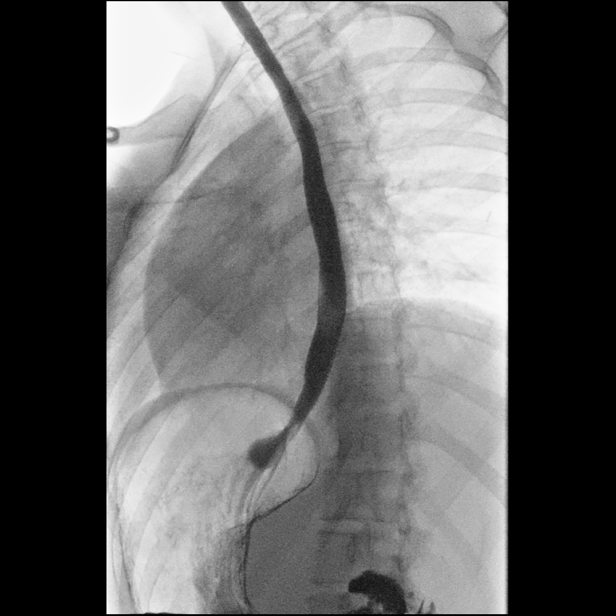

[Series 9: one shot · 1 of 2 slices shown (3 of 3)]
[im 2/2]
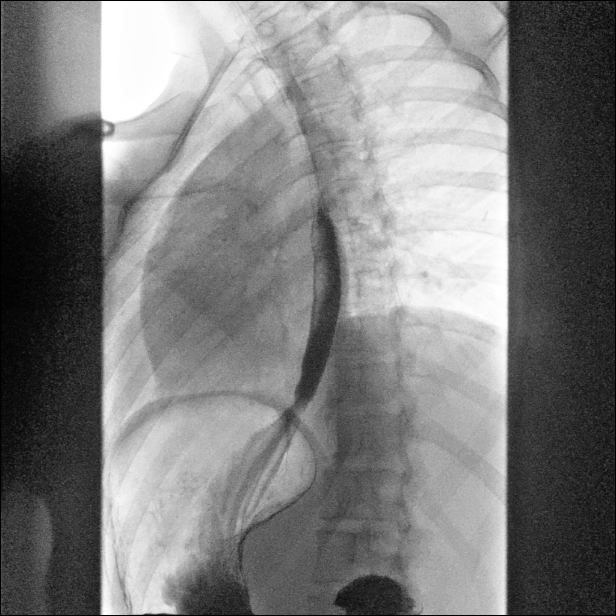

[15 of 24 positions shown; findings below may reference images not displayed]

FINDINGS: Normal swallowing mechanism. Normal esophageal motility. No
esophageal fold thickening, stricture or obstruction. Patient
declined a barium tablet.
IMPRESSION: Normal exam.  Patient declined a barium tablet.

## 2023-06-11 DIAGNOSIS — R1310 Dysphagia, unspecified: Secondary | ICD-10-CM | POA: Diagnosis not present

## 2023-06-11 DIAGNOSIS — K219 Gastro-esophageal reflux disease without esophagitis: Secondary | ICD-10-CM | POA: Diagnosis not present

## 2023-06-11 DIAGNOSIS — K200 Eosinophilic esophagitis: Secondary | ICD-10-CM | POA: Diagnosis not present

## 2023-10-25 ENCOUNTER — Other Ambulatory Visit (HOSPITAL_COMMUNITY)
Admission: RE | Admit: 2023-10-25 | Discharge: 2023-10-25 | Disposition: A | Discharge status: Home / Self Care | Payer: BC Managed Care – PPO | Source: Ambulatory Visit | Attending: Obstetrics and Gynecology | Admitting: Obstetrics and Gynecology

## 2023-10-25 ENCOUNTER — Encounter: Payer: Self-pay | Admitting: Obstetrics and Gynecology

## 2023-10-25 ENCOUNTER — Ambulatory Visit (INDEPENDENT_AMBULATORY_CARE_PROVIDER_SITE_OTHER): Payer: BC Managed Care – PPO | Admitting: Obstetrics and Gynecology

## 2023-10-25 VITALS — BP 123/78 | HR 65 | Wt 125.0 lb

## 2023-10-25 DIAGNOSIS — N926 Irregular menstruation, unspecified: Secondary | ICD-10-CM | POA: Diagnosis not present

## 2023-10-25 DIAGNOSIS — Z01419 Encounter for gynecological examination (general) (routine) without abnormal findings: Secondary | ICD-10-CM | POA: Insufficient documentation

## 2023-10-25 DIAGNOSIS — Z1151 Encounter for screening for human papillomavirus (HPV): Secondary | ICD-10-CM | POA: Diagnosis not present

## 2023-10-25 DIAGNOSIS — Z124 Encounter for screening for malignant neoplasm of cervix: Secondary | ICD-10-CM | POA: Insufficient documentation

## 2023-10-25 DIAGNOSIS — Z3009 Encounter for other general counseling and advice on contraception: Secondary | ICD-10-CM

## 2023-10-25 NOTE — Patient Instructions (Signed)
Tranexamic acid cannot be crushed - this is the medication that can help lighten cycles Non-oral contraceptives: patch, vaginal ring, implant (nexplanon), intrauterine device, depo-provera injection

## 2023-10-25 NOTE — Progress Notes (Signed)
ANNUAL EXAM Patient name: Paula Franco MRN [id number]  Date of birth: November 11, 2000 Chief Complaint:   Gynecologic Exam  History of Present Illness:   Paula Franco is a 23 y.o. G0P0000 being seen today for a routine annual exam.  Current complaints: annual, pap  Menstrual concerns? Yes  irregular menses Breast or nipple changes? No hx of breast reduction Contraception use? Yes condoms Sexually active? Yes female partner  May skip a month or get 2 in one month, more recently has been having them irregularly  Diagnosed with EOE (enosphilic esophagitis) and lost a lot weight and menses were gone for a year; feels weight has returned within the last 6 months and has noted menses returning during this time Notes passing clots while on her menses Does not want to be birth control - started at 46 due to getting menses twice monthly and restarted when 23 years old  Currently sexually - female partner, no pain with intercourse, has been having spotting afterwards more recently. Has had this happen every time. Occurs each time. Also notes having had significant bleeding after first pap smear.   Patient's last menstrual period was 10/14/2023 (exact date).   The pregnancy intention screening data noted above was reviewed. Potential methods of contraception were discussed. The patient elected to proceed with No data recorded.   Last pap 05/2019 NILM Last mammogram: n/a.  Last colonoscopy: n/a.       No data to display               No data to display           Review of Systems:   Pertinent items are noted in HPI Denies any headaches, blurred vision, fatigue, shortness of breath, chest pain, abdominal pain, abnormal vaginal discharge/itching/odor/irritation, problems with periods, bowel movements, urination, or intercourse unless otherwise stated above. Pertinent History Reviewed:  Reviewed past medical,surgical, social and family history.  Reviewed problem list, medications and  allergies. Physical Assessment:   Vitals:   10/25/23 1013  BP: 123/78  Pulse: 65  Weight: 125 lb (56.7 kg)  Body mass index is 22.86 kg/m.        Physical Examination:   General appearance - well appearing, and in no distress  Mental status - alert, oriented to person, place, and time  Psych:  She has a normal mood and affect  Skin - warm and dry, normal color, no suspicious lesions noted  Chest - effort normal, all lung fields clear to auscultation bilaterally  Heart - normal rate and regular rhythm  Breasts - breasts appear normal, no suspicious masses, no skin or nipple changes or  axillary nodes, well healed reduction scars  Abdomen - soft, nontender, nondistended, no masses or organomegaly  Pelvic -  VULVA: normal appearing vulva with no masses, tenderness or lesions   VAGINA: normal appearing vagina with normal color and discharge, no lesions   CERVIX: friable cervix with erythema in upper > Lower half of the cervix  Thin prep pap is done with HR HPV cotesting  UTERUS: uterus is felt to be normal size, shape, consistency and nontender   ADNEXA: No adnexal masses or tenderness noted.  Extremities:  No swelling or varicosities noted  Chaperone present for exam  No results found for this or any previous visit (from the past 24 hours).    Assessment & Plan:  1. Well woman exam with routine gynecological exam (Primary) - Cervical cancer screening: Discussed screening Q3 years. Reviewed importance of  annual exams and limits of pap smear. Pap with reflex HPV collected - GC/CT: Discussed and recommended. Pt  declines - Birth Control: Condoms - Breast Health: Encouraged self breast awareness/exams.  - Follow-up: 12 months and prn  - Cytology - PAP( [facility])  2. Screening for cervical cancer Pap collected - Cytology - PAP( [facility])  3. Irregular menses Likely secondary due to significant weight fluctuations. Offered hormonal intervention to lighten flow as well as  TXA. Notes not able to swallow pills so TXA not a viable option as it cannot be crusehd  4. Encounter for counseling regarding contraception Reviewed all forms of birth control options available including abstinence; over the counter/barrier methods; hormonal contraceptive medication including pill, patch, ring, injection,contraceptive implant; hormonal and nonhormonal IUDs; permanent sterilization options including vasectomy and the various tubal sterilization modalities. Risks and benefits reviewed.  Questions were answered.  Information was given to patient to review.   Noted there are multiple non-oral options that can be employed for menstrual regulation and contraception.   No orders of the defined types were placed in this encounter.   Meds: No orders of the defined types were placed in this encounter.   Follow-up: No follow-ups on file.  Lorriane Shire, MD 10/25/2023 10:21 AM

## 2023-10-29 LAB — CYTOLOGY - PAP: Diagnosis: NEGATIVE

## 2023-10-30 ENCOUNTER — Telehealth: Payer: Self-pay

## 2023-10-30 ENCOUNTER — Encounter: Payer: Self-pay | Admitting: Obstetrics and Gynecology

## 2023-10-30 NOTE — Telephone Encounter (Signed)
-----   Message from [place] sent at 10/30/2023 12:03 PM EST ----- Regarding: follow up Hello,  Schedule follow up for postcoital bleeding.  Thanks,  Ajewole

## 2023-10-30 NOTE — Telephone Encounter (Signed)
 Left voicemail for patient to call back and schedule a follow up with Dr. Elester Grim.

## 5192-05-25 DEATH — deceased
